# Patient Record
Sex: Male | Born: 1960
Health system: Southern US, Community
[De-identification: ages and names within clinical notes are randomized; demographics above are authoritative.]

## PROBLEM LIST (undated history)

## (undated) DIAGNOSIS — N390 Urinary tract infection, site not specified: Secondary | ICD-10-CM

## (undated) DIAGNOSIS — B019 Varicella without complication: Secondary | ICD-10-CM

## (undated) DIAGNOSIS — D239 Other benign neoplasm of skin, unspecified: Secondary | ICD-10-CM

## (undated) DIAGNOSIS — R011 Cardiac murmur, unspecified: Secondary | ICD-10-CM

## (undated) HISTORY — DX: Urinary tract infection, site not specified: N39.0

## (undated) HISTORY — DX: Cardiac murmur, unspecified: R01.1

## (undated) HISTORY — DX: Other benign neoplasm of skin, unspecified: D23.9

## (undated) HISTORY — DX: Varicella without complication: B01.9

## (undated) HISTORY — PX: KNEE CARTILAGE SURGERY: SHX688

## (undated) HISTORY — PX: WISDOM TOOTH EXTRACTION: SHX21

## (undated) HISTORY — PX: VASECTOMY: SHX75

---
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
## 2013-07-31 ENCOUNTER — Encounter: Payer: Self-pay | Admitting: Physician Assistant

## 2013-07-31 ENCOUNTER — Ambulatory Visit (INDEPENDENT_AMBULATORY_CARE_PROVIDER_SITE_OTHER): Payer: 59 | Admitting: Physician Assistant

## 2013-07-31 VITALS — BP 126/82 | HR 64 | Temp 98.0°F | Resp 16 | Ht 72.0 in | Wt 201.5 lb

## 2013-07-31 DIAGNOSIS — Z0000 Encounter for general adult medical examination without abnormal findings: Secondary | ICD-10-CM

## 2013-07-31 DIAGNOSIS — L989 Disorder of the skin and subcutaneous tissue, unspecified: Secondary | ICD-10-CM

## 2013-07-31 DIAGNOSIS — Z808 Family history of malignant neoplasm of other organs or systems: Secondary | ICD-10-CM

## 2013-07-31 LAB — CBC WITH DIFFERENTIAL/PLATELET
Basophils Absolute: 0 10*3/uL (ref 0.0–0.1)
Basophils Relative: 0 % (ref 0–1)
Eosinophils Absolute: 0.2 10*3/uL (ref 0.0–0.7)
Eosinophils Relative: 4 % (ref 0–5)
HCT: 43.4 % (ref 39.0–52.0)
Hemoglobin: 15 g/dL (ref 13.0–17.0)
Lymphocytes Relative: 25 % (ref 12–46)
Lymphs Abs: 1.4 10*3/uL (ref 0.7–4.0)
MCH: 29.8 pg (ref 26.0–34.0)
MCHC: 34.6 g/dL (ref 30.0–36.0)
MCV: 86.1 fL (ref 78.0–100.0)
Monocytes Absolute: 0.5 10*3/uL (ref 0.1–1.0)
Monocytes Relative: 8 % (ref 3–12)
Neutro Abs: 3.6 10*3/uL (ref 1.7–7.7)
Neutrophils Relative %: 63 % (ref 43–77)
Platelets: 174 10*3/uL (ref 150–400)
RBC: 5.04 MIL/uL (ref 4.22–5.81)
RDW: 14.3 % (ref 11.5–15.5)
WBC: 5.7 10*3/uL (ref 4.0–10.5)

## 2013-07-31 NOTE — Progress Notes (Signed)
Pre visit review using our clinic review tool, if applicable. No additional management support is needed unless otherwise documented below in the visit note/SLS  

## 2013-07-31 NOTE — Patient Instructions (Signed)
Please go to the lab.  I will call you with your results.  You will be contacted by Dermatology and by GI for a colonoscopy.  Follow-up in 1 year and sooner as needed.  Preventive Care for Adults, Male A healthy lifestyle and preventive care can promote health and wellness. Preventive health guidelines for men include the following key practices:  A routine yearly physical is a good way to check with your health care provider about your health and preventative screening. It is a chance to share any concerns and updates on your health and to receive a thorough exam.  Visit your dentist for a routine exam and preventative care every 6 months. Brush your teeth twice a day and floss once a day. Good oral hygiene prevents tooth decay and gum disease.  The frequency of eye exams is based on your age, health, family medical history, use of contact lenses, and other factors. Follow your health care provider's recommendations for frequency of eye exams.  Eat a healthy diet. Foods such as vegetables, fruits, whole grains, low-fat dairy products, and lean protein foods contain the nutrients you need without too many calories. Decrease your intake of foods high in solid fats, added sugars, and salt. Eat the right amount of calories for you.Get information about a proper diet from your health care provider, if necessary.  Regular physical exercise is one of the most important things you can do for your health. Most adults should get at least 150 minutes of moderate-intensity exercise (any activity that increases your heart rate and causes you to sweat) each week. In addition, most adults need muscle-strengthening exercises on 2 or more days a week.  Maintain a healthy weight. The body mass index (BMI) is a screening tool to identify possible weight problems. It provides an estimate of body fat based on height and weight. Your health care provider can find your BMI and can help you achieve or maintain a healthy  weight.For adults 20 years and older:  A BMI below 18.5 is considered underweight.  A BMI of 18.5 to 24.9 is normal.  A BMI of 25 to 29.9 is considered overweight.  A BMI of 30 and above is considered obese.  Maintain normal blood lipids and cholesterol levels by exercising and minimizing your intake of saturated fat. Eat a balanced diet with plenty of fruit and vegetables. Blood tests for lipids and cholesterol should begin at age 55 and be repeated every 5 years. If your lipid or cholesterol levels are high, you are over 50, or you are at high risk for heart disease, you may need your cholesterol levels checked more frequently.Ongoing high lipid and cholesterol levels should be treated with medicines if diet and exercise are not working.  If you smoke, find out from your health care provider how to quit. If you do not use tobacco, do not start.  Lung cancer screening is recommended for adults aged 62 80 years who are at high risk for developing lung cancer because of a history of smoking. A yearly low-dose CT scan of the lungs is recommended for people who have at least a 30-pack-year history of smoking and are a current smoker or have quit within the past 15 years. A pack year of smoking is smoking an average of 1 pack of cigarettes a day for 1 year (for example: 1 pack a day for 30 years or 2 packs a day for 15 years). Yearly screening should continue until the smoker has stopped smoking  for at least 15 years. Yearly screening should be stopped for people who develop a health problem that would prevent them from having lung cancer treatment.  If you choose to drink alcohol, do not have more than 2 drinks per day. One drink is considered to be 12 ounces (355 mL) of beer, 5 ounces (148 mL) of wine, or 1.5 ounces (44 mL) of liquor.  Avoid use of street drugs. Do not share needles with anyone. Ask for help if you need support or instructions about stopping the use of drugs.  High blood pressure  causes heart disease and increases the risk of stroke. Your blood pressure should be checked at least every 1 2 years. Ongoing high blood pressure should be treated with medicines, if weight loss and exercise are not effective.  If you are 31 52 years old, ask your health care provider if you should take aspirin to prevent heart disease.  Diabetes screening involves taking a blood sample to check your fasting blood sugar level. This should be done once every 3 years, after age 31, if you are within normal weight and without risk factors for diabetes. Testing should be considered at a younger age or be carried out more frequently if you are overweight and have at least 1 risk factor for diabetes.  Colorectal cancer can be detected and often prevented. Most routine colorectal cancer screening begins at the age of 42 and continues through age 50. However, your health care provider may recommend screening at an earlier age if you have risk factors for colon cancer. On a yearly basis, your health care provider may provide home test kits to check for hidden blood in the stool. Use of a small camera at the end of a tube to directly examine the colon (sigmoidoscopy or colonoscopy) can detect the earliest forms of colorectal cancer. Talk to your health care provider about this at age 6, when routine screening begins. Direct exam of the colon should be repeated every 5 10 years through age 5, unless early forms of precancerous polyps or small growths are found.  People who are at an increased risk for hepatitis B should be screened for this virus. You are considered at high risk for hepatitis B if:  You were born in a country where hepatitis B occurs often. Talk with your health care provider about which countries are considered high-risk.  Your parents were born in a high-risk country and you have not received a shot to protect against hepatitis B (hepatitis B vaccine).  You have HIV or AIDS.  You use  needles to inject street drugs.  You live with, or have sex with, someone who has hepatitis B.  You are a man who has sex with other men (MSM).  You get hemodialysis treatment.  You take certain medicines for conditions such as cancer, organ transplantation, and autoimmune conditions.  Hepatitis C blood testing is recommended for all people born from 1 through 1965 and any individual with known risks for hepatitis C.  Practice safe sex. Use condoms and avoid high-risk sexual practices to reduce the spread of sexually transmitted infections (STIs). STIs include gonorrhea, chlamydia, syphilis, trichomonas, herpes, HPV, and human immunodeficiency virus (HIV). Herpes, HIV, and HPV are viral illnesses that have no cure. They can result in disability, cancer, and death.  A one-time screening for abdominal aortic aneurysm (AAA) and surgical repair of large AAAs by ultrasound are recommended for men ages 76 to 23 years who are current or former  smokers.  Healthy men should no longer receive prostate-specific antigen (PSA) blood tests as part of routine cancer screening. Talk with your health care provider about prostate cancer screening.  Testicular cancer screening is not recommended for adult males who have no symptoms. Screening includes self-exam, a health care provider exam, and other screening tests. Consult with your health care provider about any symptoms you have or any concerns you have about testicular cancer.  Use sunscreen. Apply sunscreen liberally and repeatedly throughout the day. You should seek shade when your shadow is shorter than you. Protect yourself by wearing long sleeves, pants, a wide-brimmed hat, and sunglasses year round, whenever you are outdoors.  Once a month, do a whole-body skin exam, using a mirror to look at the skin on your back. Tell your health care provider about new moles, moles that have irregular borders, moles that are larger than a pencil eraser, or moles  that have changed in shape or color.  Stay current with required vaccines (immunizations).  Influenza vaccine. All adults should be immunized every year.  Tetanus, diphtheria, and acellular pertussis (Td, Tdap) vaccine. An adult who has not previously received Tdap or who does not know his vaccine status should receive 1 dose of Tdap. This initial dose should be followed by tetanus and diphtheria toxoids (Td) booster doses every 10 years. Adults with an unknown or incomplete history of completing a 3-dose immunization series with Td-containing vaccines should begin or complete a primary immunization series including a Tdap dose. Adults should receive a Td booster every 10 years.  Varicella vaccine. An adult without evidence of immunity to varicella should receive 2 doses or a second dose if he has previously received 1 dose.  Human papillomavirus (HPV) vaccine. Males aged 82 21 years who have not received the vaccine previously should receive the 3-dose series. Males aged 74 26 years may be immunized. Immunization is recommended through the age of 21 years for any male who has sex with males and did not get any or all doses earlier. Immunization is recommended for any person with an immunocompromised condition through the age of 65 years if he did not get any or all doses earlier. During the 3-dose series, the second dose should be obtained 4 8 weeks after the first dose. The third dose should be obtained 24 weeks after the first dose and 16 weeks after the second dose.  Zoster vaccine. One dose is recommended for adults aged 92 years or older unless certain conditions are present.  Measles, mumps, and rubella (MMR) vaccine. Adults born before [birth year] generally are considered immune to measles and mumps. Adults born in [birth year] or later should have 1 or more doses of MMR vaccine unless there is a contraindication to the vaccine or there is laboratory evidence of immunity to each of the three diseases. A  routine second dose of MMR vaccine should be obtained at least 28 days after the first dose for students attending postsecondary schools, health care workers, or international travelers. People who received inactivated measles vaccine or an unknown type of measles vaccine during 1963 1967 should receive 2 doses of MMR vaccine. People who received inactivated mumps vaccine or an unknown type of mumps vaccine before 1979 and are at high risk for mumps infection should consider immunization with 2 doses of MMR vaccine. Unvaccinated health care workers born before [birth year] who lack laboratory evidence of measles, mumps, or rubella immunity or laboratory confirmation of disease should consider measles and mumps immunization with 2  doses of MMR vaccine or rubella immunization with 1 dose of MMR vaccine.  Pneumococcal 13-valent conjugate (PCV13) vaccine. When indicated, a person who is uncertain of his immunization history and has no record of immunization should receive the PCV13 vaccine. An adult aged 28 years or older who has certain medical conditions and has not been previously immunized should receive 1 dose of PCV13 vaccine. This PCV13 should be followed with a dose of pneumococcal polysaccharide (PPSV23) vaccine. The PPSV23 vaccine dose should be obtained at least 8 weeks after the dose of PCV13 vaccine. An adult aged 72 years or older who has certain medical conditions and previously received 1 or more doses of PPSV23 vaccine should receive 1 dose of PCV13. The PCV13 vaccine dose should be obtained 1 or more years after the last PPSV23 vaccine dose.  Pneumococcal polysaccharide (PPSV23) vaccine. When PCV13 is also indicated, PCV13 should be obtained first. All adults aged 68 years and older should be immunized. An adult younger than age 61 years who has certain medical conditions should be immunized. Any person who resides in a nursing home or long-term care facility should be immunized. An adult smoker should be  immunized. People with an immunocompromised condition and certain other conditions should receive both PCV13 and PPSV23 vaccines. People with human immunodeficiency virus (HIV) infection should be immunized as soon as possible after diagnosis. Immunization during chemotherapy or radiation therapy should be avoided. Routine use of PPSV23 vaccine is not recommended for American Indians, [place] Natives, or people younger than 65 years unless there are medical conditions that require PPSV23 vaccine. When indicated, people who have unknown immunization and have no record of immunization should receive PPSV23 vaccine. One-time revaccination 5 years after the first dose of PPSV23 is recommended for people aged 78 64 years who have chronic kidney failure, nephrotic syndrome, asplenia, or immunocompromised conditions. People who received 1 2 doses of PPSV23 before age 56 years should receive another dose of PPSV23 vaccine at age 22 years or later if at least 5 years have passed since the previous dose. Doses of PPSV23 are not needed for people immunized with PPSV23 at or after age 32 years.  Meningococcal vaccine. Adults with asplenia or persistent complement component deficiencies should receive 2 doses of quadrivalent meningococcal conjugate (MenACWY-D) vaccine. The doses should be obtained at least 2 months apart. Microbiologists working with certain meningococcal bacteria, [employer], people at risk during an outbreak, and people who travel to or live in countries with a high rate of meningitis should be immunized. A first-year college student up through age 36 years who is living in a residence hall should receive a dose if he did not receive a dose on or after his 16th birthday. Adults who have certain high-risk conditions should receive one or more doses of vaccine.  Hepatitis A vaccine. Adults who wish to be protected from this disease, have certain high-risk conditions, work with hepatitis A-infected  animals, work in hepatitis A research labs, or travel to or work in countries with a high rate of hepatitis A should be immunized. Adults who were previously unvaccinated and who anticipate close contact with an international adoptee during the first 60 days after arrival in the [place] from a country with a high rate of hepatitis A should be immunized.  Hepatitis B vaccine. Adults who wish to be protected from this disease, have certain high-risk conditions, may be exposed to blood or other infectious body fluids, are household contacts or sex partners of hepatitis  B positive people, are clients or workers in certain care facilities, or travel to or work in countries with a high rate of hepatitis B should be immunized.  Haemophilus influenzae type b (Hib) vaccine. A previously unvaccinated person with asplenia or sickle cell disease or having a scheduled splenectomy should receive 1 dose of Hib vaccine. Regardless of previous immunization, a recipient of a hematopoietic stem cell transplant should receive a 3-dose series 6 12 months after his successful transplant. Hib vaccine is not recommended for adults with HIV infection. Preventive Service / Frequency Ages 1 to 62  Blood pressure check.** / Every 1 to 2 years.  Lipid and cholesterol check.** / Every 5 years beginning at age 29.  Hepatitis C blood test.** / For any individual with known risks for hepatitis C.  Skin self-exam. / Monthly.  Influenza vaccine. / Every year.  Tetanus, diphtheria, and acellular pertussis (Tdap, Td) vaccine.** / Consult your health care provider. 1 dose of Td every 10 years.  Varicella vaccine.** / Consult your health care provider.  HPV vaccine. / 3 doses over 6 months, if 42 or younger.  Measles, mumps, rubella (MMR) vaccine.** / You need at least 1 dose of MMR if you were born in 1957 or later. You may also need a second dose.  Pneumococcal 13-valent conjugate (PCV13) vaccine.** / Consult your  health care provider.  Pneumococcal polysaccharide (PPSV23) vaccine.** / 1 to 2 doses if you smoke cigarettes or if you have certain conditions.  Meningococcal vaccine.** / 1 dose if you are age 7 to 15 years and a Market researcher living in a residence hall, or have one of several medical conditions. You may also need additional booster doses.  Hepatitis A vaccine.** / Consult your health care provider.  Hepatitis B vaccine.** / Consult your health care provider.  Haemophilus influenzae type b (Hib) vaccine.** / Consult your health care provider. Ages 66 to 43  Blood pressure check.** / Every 1 to 2 years.  Lipid and cholesterol check.** / Every 5 years beginning at age 73.  Lung cancer screening. / Every year if you are aged 11 80 years and have a 30-pack-year history of smoking and currently smoke or have quit within the past 15 years. Yearly screening is stopped once you have quit smoking for at least 15 years or develop a health problem that would prevent you from having lung cancer treatment.  Fecal occult blood test (FOBT) of stool. / Every year beginning at age 74 and continuing until age 26. You may not have to do this test if you get a colonoscopy every 10 years.  Flexible sigmoidoscopy** or colonoscopy.** / Every 5 years for a flexible sigmoidoscopy or every 10 years for a colonoscopy beginning at age 66 and continuing until age 87.  Hepatitis C blood test.** / For all people born from [birth year] through 1964 and any individual with known risks for hepatitis C.  Skin self-exam. / Monthly.  Influenza vaccine. / Every year.  Tetanus, diphtheria, and acellular pertussis (Tdap/Td) vaccine.** / Consult your health care provider. 1 dose of Td every 10 years.  Varicella vaccine.** / Consult your health care provider.  Zoster vaccine.** / 1 dose for adults aged 71 years or older.  Measles, mumps, rubella (MMR) vaccine.** / You need at least 1 dose of MMR if you were born in  1956 or later. You may also need a second dose.  Pneumococcal 13-valent conjugate (PCV13) vaccine.** / Consult your health care provider.  Pneumococcal  polysaccharide (PPSV23) vaccine.** / 1 to 2 doses if you smoke cigarettes or if you have certain conditions.  Meningococcal vaccine.** / Consult your health care provider.  Hepatitis A vaccine.** / Consult your health care provider.  Hepatitis B vaccine.** / Consult your health care provider.  Haemophilus influenzae type b (Hib) vaccine.** / Consult your health care provider. Ages 43 and over  Blood pressure check.** / Every 1 to 2 years.  Lipid and cholesterol check.**/ Every 5 years beginning at age 44.  Lung cancer screening. / Every year if you are aged 29 80 years and have a 30-pack-year history of smoking and currently smoke or have quit within the past 15 years. Yearly screening is stopped once you have quit smoking for at least 15 years or develop a health problem that would prevent you from having lung cancer treatment.  Fecal occult blood test (FOBT) of stool. / Every year beginning at age 52 and continuing until age 67. You may not have to do this test if you get a colonoscopy every 10 years.  Flexible sigmoidoscopy** or colonoscopy.** / Every 5 years for a flexible sigmoidoscopy or every 10 years for a colonoscopy beginning at age 78 and continuing until age 82.  Hepatitis C blood test.** / For all people born from [birth year] through 1964 and any individual with known risks for hepatitis C.  Abdominal aortic aneurysm (AAA) screening.** / A one-time screening for ages 28 to 86 years who are current or former smokers.  Skin self-exam. / Monthly.  Influenza vaccine. / Every year.  Tetanus, diphtheria, and acellular pertussis (Tdap/Td) vaccine.** / 1 dose of Td every 10 years.  Varicella vaccine.** / Consult your health care provider.  Zoster vaccine.** / 1 dose for adults aged 73 years or older.  Pneumococcal 13-valent  conjugate (PCV13) vaccine.** / Consult your health care provider.  Pneumococcal polysaccharide (PPSV23) vaccine.** / 1 dose for all adults aged 37 years and older.  Meningococcal vaccine.** / Consult your health care provider.  Hepatitis A vaccine.** / Consult your health care provider.  Hepatitis B vaccine.** / Consult your health care provider.  Haemophilus influenzae type b (Hib) vaccine.** / Consult your health care provider. **Family history and personal history of risk and conditions may change your health care provider's recommendations. Document Released: 07/03/2001 Document Revised: 02/25/2013 Document Reviewed: 10/02/2010 [facility] Patient Information 2014 [place], [place].

## 2013-07-31 NOTE — Progress Notes (Signed)
Patient presents to clinic today to establish care.  Acute Concerns: Skin Lesion -- Patient has a place on his back that is slightly itchy.  Would like it checked out.  Denies excess sun exposure.  Patient denies hx of severe burn.  Denies hx of skin cancer.  Does have significant family history of melanoma.  Chronic Issues: Denies chronic known PMH  Health Maintenance: Dental -- UTD Vision -- UTD Immunizations -- Tetanus in 2012.  Declines flu shot. Colonoscopy -- Overdue. Needs referral. PSA -- never checked; no family hx of prostate cancer  Past Medical History  Diagnosis Date  . Heart murmur     childhood, resolved  . Chicken pox   . UTI (lower urinary tract infection)   . Skin benign neoplasm     Past Surgical History  Procedure Laterality Date  . Adenoidectomy  1969  . Wisdom tooth extraction    . Vasectomy      No current outpatient prescriptions on file prior to visit.   No current facility-administered medications on file prior to visit.    No Known Allergies  Family History  Problem Relation Age of Onset  . Melanoma Father     Living  . Melanoma Paternal Grandfather     Deceased  . Cancer Paternal Grandfather   . Healthy Mother   . Healthy Son   . Healthy Daughter     History   Social History  . Marital Status: Married    Spouse Name: N/A    Number of Children: N/A  . Years of Education: N/A   Occupational History  . Not on file.   Social History Main Topics  . Smoking status: Never Smoker   . Smokeless tobacco: Never Used  . Alcohol Use: 6.0 oz/week    10 Cans of beer per week  . Drug Use: No  . Sexual Activity: Yes    Birth Control/ Protection: Surgical   Other Topics Concern  . Not on file   Social History Narrative  . No narrative on file   Review of Systems  Constitutional: Negative for fever and weight loss.  HENT: Negative for ear discharge, ear pain, hearing loss and tinnitus.   Eyes: Negative for blurred vision, double  vision, photophobia and pain.  Respiratory: Negative for cough and shortness of breath.   Cardiovascular: Negative for chest pain and palpitations.  Gastrointestinal: Negative for heartburn, nausea, vomiting, abdominal pain, diarrhea, constipation, blood in stool and melena.  Genitourinary: Negative for dysuria, urgency, frequency, hematuria and flank pain.       Nocturia x 0-1.  No concerns about erectile function.  Neurological: Negative for dizziness, seizures, loss of consciousness and headaches.  Endo/Heme/Allergies: Negative for environmental allergies.  Psychiatric/Behavioral: Negative for depression, suicidal ideas, hallucinations and substance abuse. The patient is not nervous/anxious and does not have insomnia.    BP 126/82  Pulse 64  Temp(Src) 98 F (36.7 C) (Oral)  Resp 16  Ht 6' (1.829 m)  Wt 201 lb 8 oz (91.4 kg)  BMI 27.32 kg/m2  SpO2 98%  Physical Exam  Vitals reviewed. Constitutional: He is oriented to person, place, and time and well-developed, well-nourished, and in no distress.  HENT:  Head: Normocephalic and atraumatic.  Right Ear: External ear normal.  Left Ear: External ear normal.  Nose: Nose normal.  Mouth/Throat: Oropharynx is clear and moist. No oropharyngeal exudate.  TM within normal limits bilaterally.  Eyes: Conjunctivae are normal. Pupils are equal, round, and reactive to light.  Neck: Neck supple.  Cardiovascular: Normal rate, regular rhythm, normal heart sounds and intact distal pulses.   Pulmonary/Chest: Effort normal and breath sounds normal. No respiratory distress. He has no wheezes. He has no rales. He exhibits no tenderness.  Abdominal: Soft. Bowel sounds are normal. He exhibits no distension and no mass. There is no tenderness. There is no rebound and no guarding.  Genitourinary: Testes/scrotum normal and penis normal.  Lymphadenopathy:    He has no cervical adenopathy.  Neurological: He is alert and oriented to person, place, and time.   Skin: Skin is warm and dry. No rash noted.  Multiple cherry angiomas over torso and extremities.  Lesion of concern is located in the mid thoracic region.  Dichromic and irregular borders.  Seems like a lentigo, however to due characteristics of melanoma needs evaluation by specialist.  Psychiatric: Affect normal.   Assessment/Plan: Cherry angioma Benign lesion.  However, patient has numerous.  Will be checking CMP.  Visit for preventive health examination Medical history reviewed.  Will obtain fasting labs.  Referral to GI placed for colonoscopy.  Family hx of melanoma Questionable skin lesion of back.  Referral to Dermatology placed.

## 2013-08-01 LAB — HEPATIC FUNCTION PANEL
ALT: 18 U/L (ref 0–53)
AST: 19 U/L (ref 0–37)
Albumin: 4.5 g/dL (ref 3.5–5.2)
Alkaline Phosphatase: 50 U/L (ref 39–117)
Bilirubin, Direct: 0.1 mg/dL (ref 0.0–0.3)
Indirect Bilirubin: 0.5 mg/dL (ref 0.2–1.2)
Total Bilirubin: 0.6 mg/dL (ref 0.2–1.2)
Total Protein: 7.1 g/dL (ref 6.0–8.3)

## 2013-08-01 LAB — PSA: PSA: 2.33 ng/mL (ref <=4.00)

## 2013-08-01 LAB — URINALYSIS, ROUTINE W REFLEX MICROSCOPIC
Bilirubin Urine: NEGATIVE
Glucose, UA: NEGATIVE mg/dL
Hgb urine dipstick: NEGATIVE
Ketones, ur: NEGATIVE mg/dL
Leukocytes, UA: NEGATIVE
Nitrite: NEGATIVE
Protein, ur: NEGATIVE mg/dL
Specific Gravity, Urine: 1.016 (ref 1.005–1.030)
Urobilinogen, UA: 0.2 mg/dL (ref 0.0–1.0)
pH: 5.5 (ref 5.0–8.0)

## 2013-08-01 LAB — LIPID PANEL
Cholesterol: 204 mg/dL — ABNORMAL HIGH (ref 0–200)
HDL: 46 mg/dL (ref >39)
LDL Cholesterol: 137 mg/dL — ABNORMAL HIGH (ref 0–99)
Total CHOL/HDL Ratio: 4.4 Ratio
Triglycerides: 107 mg/dL (ref <150)
VLDL: 21 mg/dL (ref 0–40)

## 2013-08-01 LAB — TSH: TSH: 2.068 u[IU]/mL (ref 0.350–4.500)

## 2013-08-01 LAB — BASIC METABOLIC PANEL
BUN: 12 mg/dL (ref 6–23)
CO2: 28 mEq/L (ref 19–32)
Calcium: 9.4 mg/dL (ref 8.4–10.5)
Chloride: 104 mEq/L (ref 96–112)
Creat: 0.93 mg/dL (ref 0.50–1.35)
Glucose, Bld: 90 mg/dL (ref 70–99)
Potassium: 4 mEq/L (ref 3.5–5.3)
Sodium: 140 mEq/L (ref 135–145)

## 2013-08-03 ENCOUNTER — Encounter: Payer: Self-pay | Admitting: Internal Medicine

## 2013-08-03 DIAGNOSIS — I781 Nevus, non-neoplastic: Secondary | ICD-10-CM

## 2013-08-03 DIAGNOSIS — Z808 Family history of malignant neoplasm of other organs or systems: Secondary | ICD-10-CM | POA: Insufficient documentation

## 2013-08-03 DIAGNOSIS — Z0000 Encounter for general adult medical examination without abnormal findings: Secondary | ICD-10-CM | POA: Insufficient documentation

## 2013-08-03 DIAGNOSIS — D1801 Hemangioma of skin and subcutaneous tissue: Secondary | ICD-10-CM | POA: Insufficient documentation

## 2013-08-03 NOTE — Assessment & Plan Note (Signed)
Benign lesion.  However, patient has numerous.  Will be checking CMP.

## 2013-08-03 NOTE — Assessment & Plan Note (Signed)
Questionable skin lesion of back.  Referral to Dermatology placed.

## 2013-08-03 NOTE — Assessment & Plan Note (Signed)
Medical history reviewed.  Will obtain fasting labs.  Referral to GI placed for colonoscopy.

## 2013-09-21 ENCOUNTER — Ambulatory Visit (AMBULATORY_SURGERY_CENTER): Payer: Self-pay | Admitting: *Deleted

## 2013-09-21 VITALS — Ht 72.0 in | Wt 201.2 lb

## 2013-09-21 DIAGNOSIS — Z1211 Encounter for screening for malignant neoplasm of colon: Secondary | ICD-10-CM

## 2013-09-21 HISTORY — PX: OTHER SURGICAL HISTORY: SHX169

## 2013-09-21 MED ORDER — MOVIPREP 100 G PO SOLR: ORAL | Status: DC

## 2013-09-21 NOTE — Progress Notes (Signed)
Patient denies any allergies to eggs or soy. Patient denies any problems with anesthesia/sedation. Patient denies any oxygen use at home and does not take any diet/weight loss medications. EMMI education assisgned to patient on colonoscopy. 

## 2013-10-05 ENCOUNTER — Encounter: Payer: Self-pay | Admitting: Internal Medicine

## 2013-10-05 ENCOUNTER — Ambulatory Visit (AMBULATORY_SURGERY_CENTER): Payer: 59 | Admitting: Internal Medicine

## 2013-10-05 VITALS — BP 125/81 | HR 57 | Temp 96.9°F | Resp 27 | Ht 72.0 in | Wt 201.0 lb

## 2013-10-05 DIAGNOSIS — Z1211 Encounter for screening for malignant neoplasm of colon: Secondary | ICD-10-CM

## 2013-10-05 MED ORDER — SODIUM CHLORIDE 0.9 % IV SOLN: 500.0000 mL | INTRAVENOUS | Status: DC

## 2013-10-05 NOTE — Patient Instructions (Signed)
Discharge instructions given with verbal understanding. Normal exam. Resume previous medications. YOU HAD AN ENDOSCOPIC PROCEDURE TODAY AT [facility]: Refer to the procedure report that was given to you for any specific questions about what was found during the examination.  If the procedure report does not answer your questions, please call your gastroenterologist to clarify.  If you requested that your care partner not be given the details of your procedure findings, then the procedure report has been included in a sealed envelope for you to review at your convenience later.  YOU SHOULD EXPECT: Some feelings of bloating in the abdomen. Passage of more gas than usual.  Walking can help get rid of the air that was put into your GI tract during the procedure and reduce the bloating. If you had a lower endoscopy (such as a colonoscopy or flexible sigmoidoscopy) you may notice spotting of blood in your stool or on the toilet paper. If you underwent a bowel prep for your procedure, then you may not have a normal bowel movement for a few days.  DIET: Your first meal following the procedure should be a light meal and then it is ok to progress to your normal diet.  A half-sandwich or bowl of soup is an example of a good first meal.  Heavy or fried foods are harder to digest and may make you feel nauseous or bloated.  Likewise meals heavy in dairy and vegetables can cause extra gas to form and this can also increase the bloating.  Drink plenty of fluids but you should avoid alcoholic beverages for 24 hours.  ACTIVITY: Your care partner should take you home directly after the procedure.  You should plan to take it easy, moving slowly for the rest of the day.  You can resume normal activity the day after the procedure however you should NOT DRIVE or use heavy machinery for 24 hours (because of the sedation medicines used during the test).    SYMPTOMS TO REPORT IMMEDIATELY: A gastroenterologist  can be reached at any hour.  During normal business hours, 8:30 AM to 5:00 PM Monday through Friday, call [phone].  After hours and on weekends, please call the GI answering service at [phone] who will take a message and have the physician on call contact you.   Following lower endoscopy (colonoscopy or flexible sigmoidoscopy):  Excessive amounts of blood in the stool  Significant tenderness or worsening of abdominal pains  Swelling of the abdomen that is new, acute  Fever of 100F or higher   Black, tarry-looking stoolsFollowing upper endoscopy (EGD)   FOLLOW UP: If any biopsies were taken you will be contacted by phone or by letter within the next 1-3 weeks.  Call your gastroenterologist if you have not heard about the biopsies in 3 weeks.  Our staff will call the home number listed on your records the next business day following your procedure to check on you and address any questions or concerns that you may have at that time regarding the information given to you following your procedure. This is a courtesy call and so if there is no answer at the home number and we have not heard from you through the emergency physician on call, we will assume that you have returned to your regular daily activities without incident.  SIGNATURES/CONFIDENTIALITY: You and/or your care partner have signed paperwork which will be entered into your electronic medical record.  These signatures attest to the fact that that the information  above on your After Visit Summary has been reviewed and is understood.  Full responsibility of the confidentiality of this discharge information lies with you and/or your care-partner. 

## 2013-10-05 NOTE — Progress Notes (Signed)
Pt drowst adequate resp on rA report given to PACU RN

## 2013-10-05 NOTE — Op Note (Signed)
[facility]  [address] [place], [address]   COLONOSCOPY PROCEDURE REPORT  PATIENT: Ronald Ramos, Ronald Ramos  MR#: [id number] BIRTHDATE: 1961-03-03 , 53  yrs. old GENDER: Male ENDOSCOPIST: Eustace Quail, MD REFERRED QV:[id number] [facility], [facility]. PROCEDURE DATE:  10/05/2013 PROCEDURE:   Colonoscopy, screening First Screening Colonoscopy - Avg.  risk and is 50 yrs.  old or older Yes.  Prior Negative Screening - Now for repeat screening. N/A  History of Adenoma - Now for follow-up colonoscopy & has been > or = to 3 yrs.  N/A  Polyps Removed Today? No.  Recommend repeat exam, <10 yrs? No. ASA CLASS:   Class II INDICATIONS:average risk screening. MEDICATIONS: MAC sedation, administered by CRNA and propofol (Diprivan) 330mg  IV  DESCRIPTION OF PROCEDURE:   After the risks benefits and alternatives of the procedure were thoroughly explained, informed consent was obtained.  A digital rectal exam revealed no abnormalities of the rectum.   The LB [id number]  endoscope was introduced through the anus and advanced to the cecum, which was identified by both the appendix and ileocecal valve. No adverse events experienced.   The quality of the prep was excellent, using MoviPrep  The instrument was then slowly withdrawn as the colon was fully examined.      COLON FINDINGS: A normal appearing cecum, ileocecal valve, and appendiceal orifice were identified.  The ascending, hepatic flexure, transverse, splenic flexure, descending, sigmoid colon and rectum appeared unremarkable.  No polyps or cancers were seen. Retroflexed views revealed internal hemorrhoids. The time to cecum=1 minutes 38 seconds.  Withdrawal time=7 minutes 34 seconds. The scope was withdrawn and the procedure completed.  COMPLICATIONS: There were no complications.  ENDOSCOPIC IMPRESSION: Normal colon  RECOMMENDATIONS: Continue current colorectal screening recommendations for "routine risk" patients with a  repeat colonoscopy in 10 years.   eSigned:  Eustace Quail, MD 10/05/2013 10:09 AM   cc: The Patient and Brunetta Jeans, [facility]

## 2013-10-06 ENCOUNTER — Telehealth: Payer: Self-pay

## 2013-10-06 NOTE — Telephone Encounter (Signed)
Left a message at # [phone] for the pt to call if any questions or concerns. maw

## 2015-04-04 ENCOUNTER — Ambulatory Visit (INDEPENDENT_AMBULATORY_CARE_PROVIDER_SITE_OTHER): Payer: 59 | Admitting: Family Medicine

## 2015-04-04 ENCOUNTER — Encounter: Payer: Self-pay | Admitting: Family Medicine

## 2015-04-04 ENCOUNTER — Encounter (INDEPENDENT_AMBULATORY_CARE_PROVIDER_SITE_OTHER): Payer: Self-pay

## 2015-04-04 VITALS — BP 125/88 | HR 60 | Ht 73.0 in | Wt 205.0 lb

## 2015-04-04 DIAGNOSIS — M25562 Pain in left knee: Secondary | ICD-10-CM

## 2015-04-04 NOTE — Patient Instructions (Signed)
Your knee pain is consistent with mild arthritis though a degenerative medial meniscus tear is also a possibility. Both are treated similarly initially. Ibuprofen 600 or 800mg  three times a day with food Cortisone injections are an option. It's important that you continue to stay active. Straight leg raises, straight leg raises with foot turned outwards, knee extensions 3 sets of 10 once a day (add ankle weight if these become too easy). Consider physical therapy to strengthen muscles around the joint that hurts to take pressure off of the joint itself. Ice 15 minutes at a time 3-4 times a day as needed to help with pain. Try to avoid deep squats, lunges Follow up with me in 6 weeks for reevaluation. We will consider MRI if not improving as expected.

## 2015-04-05 DIAGNOSIS — M25562 Pain in left knee: Secondary | ICD-10-CM | POA: Insufficient documentation

## 2015-04-05 NOTE — Progress Notes (Signed)
PCP: Leeanne Rio, PA-C  Subjective:   HPI: Patient is a 54 y.o. male here for left knee pain.  Patient denies known injury. He reports 6 months of worsening medial left knee pain. Pain level 2/10, up to 6/10 with movement, sharp. No swelling. Tried ibuprofen. Worse with weight bearing, better with simple motion exercises. No catching, locking. No skin changes, fever, other complaints.  Past Medical History  Diagnosis Date  . Heart murmur     childhood, resolved  . Chicken pox   . UTI (lower urinary tract infection)   . Skin benign neoplasm     No current outpatient prescriptions on file prior to visit.   No current facility-administered medications on file prior to visit.    Past Surgical History  Procedure Laterality Date  . Adenoidectomy  1969  . Wisdom tooth extraction    . Vasectomy    . Knee cartilage surgery    . Removal skin lesions Right 09/21/13    removal lesion right chest wall and mid upper back    No Known Allergies  Social History   Social History  . Marital Status: Married    Spouse Name: N/A  . Number of Children: N/A  . Years of Education: N/A   Occupational History  . Not on file.   Social History Main Topics  . Smoking status: Never Smoker   . Smokeless tobacco: Never Used  . Alcohol Use: 4.8 oz/week    8 Cans of beer per week  . Drug Use: No  . Sexual Activity: Yes    Birth Control/ Protection: Surgical   Other Topics Concern  . Not on file   Social History Narrative    Family History  Problem Relation Age of Onset  . Melanoma Father     Living  . Melanoma Paternal Grandfather     Deceased  . Cancer Paternal Grandfather   . Healthy Mother   . Healthy Son   . Healthy Daughter   . Colon cancer Neg Hx     BP 125/88 mmHg  Pulse 60  Ht 6\' 1"  (1.854 m)  Wt 205 lb (92.987 kg)  BMI 27.05 kg/m2  Review of Systems: See HPI above.    Objective:  Physical Exam:  Gen: NAD  Left knee: No gross deformity,  ecchymoses, effusion. TTP medial joint line.  No other tenderness. FROM. Negative ant/post drawers. Negative valgus/varus testing. Negative lachmanns. Negative mcmurrays, apleys, patellar apprehension. NV intact distally.  Right knee: FROM without pain.    Assessment & Plan:  1. Left knee pain - consistent with DJD, less likely degenerative medial meniscus tear.  Shown home exercises to do daily.  Ibuprofen.  Discussed tylenol, glucosamine, topical medications.  Consider PT, MRI, injection if not improving at f/u in 6 weeks.

## 2015-04-05 NOTE — Assessment & Plan Note (Signed)
consistent with DJD, less likely degenerative medial meniscus tear.  Shown home exercises to do daily.  Ibuprofen.  Discussed tylenol, glucosamine, topical medications.  Consider PT, MRI, injection if not improving at f/u in 6 weeks.

## 2015-05-24 ENCOUNTER — Encounter (INDEPENDENT_AMBULATORY_CARE_PROVIDER_SITE_OTHER): Payer: Self-pay

## 2015-05-24 ENCOUNTER — Ambulatory Visit (INDEPENDENT_AMBULATORY_CARE_PROVIDER_SITE_OTHER): Payer: 59 | Admitting: Family Medicine

## 2015-05-24 ENCOUNTER — Encounter: Payer: Self-pay | Admitting: Family Medicine

## 2015-05-24 VITALS — BP 135/97 | HR 58 | Ht 73.0 in | Wt 200.0 lb

## 2015-05-24 DIAGNOSIS — M25562 Pain in left knee: Secondary | ICD-10-CM | POA: Diagnosis not present

## 2015-05-24 NOTE — Progress Notes (Addendum)
PCP: Leeanne Rio, PA-C  Subjective:   HPI: Patient is a 55 y.o. male here for left knee pain.  04/04/15: Patient denies known injury. He reports 6 months of worsening medial left knee pain. Pain level 2/10, up to 6/10 with movement, sharp. No swelling. Tried ibuprofen. Worse with weight bearing, better with simple motion exercises. No catching, locking. No skin changes, fever, other complaints.  05/24/15: Patient reports he improved after resting for almost 6 weeks then played golf a couple times without problems. Tried jogging and doing side to side work and pain recurred. Pain is medial, sharp. Pain level 1/10 currently. Worse with twisting and these side to side motions. No catching, locking. No skin changes, fever, other complaints.  Past Medical History  Diagnosis Date  . Heart murmur     childhood, resolved  . Chicken pox   . UTI (lower urinary tract infection)   . Skin benign neoplasm     No current outpatient prescriptions on file prior to visit.   No current facility-administered medications on file prior to visit.    Past Surgical History  Procedure Laterality Date  . Adenoidectomy  1969  . Wisdom tooth extraction    . Vasectomy    . Knee cartilage surgery    . Removal skin lesions Right 09/21/13    removal lesion right chest wall and mid upper back    No Known Allergies  Social History   Social History  . Marital Status: Married    Spouse Name: N/A  . Number of Children: N/A  . Years of Education: N/A   Occupational History  . Not on file.   Social History Main Topics  . Smoking status: Never Smoker   . Smokeless tobacco: Never Used  . Alcohol Use: 4.8 oz/week    8 Cans of beer per week  . Drug Use: No  . Sexual Activity: Yes    Birth Control/ Protection: Surgical   Other Topics Concern  . Not on file   Social History Narrative    Family History  Problem Relation Age of Onset  . Melanoma Father     Living  . Melanoma  Paternal Grandfather     Deceased  . Cancer Paternal Grandfather   . Healthy Mother   . Healthy Son   . Healthy Daughter   . Colon cancer Neg Hx     BP 135/97 mmHg  Pulse 58  Ht 6\' 1"  (1.854 m)  Wt 200 lb (90.719 kg)  BMI 26.39 kg/m2  Review of Systems: See HPI above.    Objective:  Physical Exam:  Gen: NAD, comfortable in exam room.  Left knee: No gross deformity, ecchymoses, effusion. TTP medial joint line.  No other tenderness. FROM. Negative ant/post drawers. Negative valgus/varus testing. Negative lachmanns. Negative mcmurrays, apleys, patellar apprehension. NV intact distally.  Right knee: FROM without pain.    Assessment & Plan:  1. Left knee pain - consistent with DJD, less likely degenerative medial meniscus tear.  We discussed options and he would like to go ahead with imaging - MRI ordered.  Consider PT, injection in future.  Addendum:  MRI reviewed and discussed with patient.  He has a medial meniscus tear and mild DJD.  Suspect given the appearance of both that the meniscus tear is more likely the cause of his pain but advised I would recommend conservative measures first.  He will consider injection, PT vs ortho referral and call us with how he would like to proceed.

## 2015-05-24 NOTE — Assessment & Plan Note (Signed)
consistent with DJD, less likely degenerative medial meniscus tear.  We discussed options and he would like to go ahead with imaging - MRI ordered.  Consider PT, injection in future.

## 2015-05-24 NOTE — Patient Instructions (Signed)
We will go ahead with an MRI to assess for medial meniscus tear vs degree of arthritis.

## 2015-05-26 ENCOUNTER — Ambulatory Visit (HOSPITAL_COMMUNITY)
Admission: RE | Admit: 2015-05-26 | Discharge: 2015-05-26 | Disposition: A | Discharge status: Home / Self Care | Payer: 59 | Source: Ambulatory Visit | Attending: Family Medicine | Admitting: Family Medicine

## 2015-05-26 DIAGNOSIS — M94262 Chondromalacia, left knee: Secondary | ICD-10-CM | POA: Diagnosis not present

## 2015-05-26 DIAGNOSIS — M25562 Pain in left knee: Secondary | ICD-10-CM | POA: Insufficient documentation

## 2015-05-26 DIAGNOSIS — S83242A Other tear of medial meniscus, current injury, left knee, initial encounter: Secondary | ICD-10-CM | POA: Diagnosis not present

## 2015-05-26 DIAGNOSIS — X58XXXA Exposure to other specified factors, initial encounter: Secondary | ICD-10-CM | POA: Insufficient documentation

## 2015-05-28 ENCOUNTER — Ambulatory Visit (HOSPITAL_BASED_OUTPATIENT_CLINIC_OR_DEPARTMENT_OTHER): Payer: 59

## 2016-04-16 ENCOUNTER — Emergency Department (HOSPITAL_COMMUNITY): Payer: 59

## 2016-04-16 ENCOUNTER — Emergency Department (HOSPITAL_COMMUNITY)
Admission: EM | Admit: 2016-04-16 | Discharge: 2016-04-16 | Disposition: A | Discharge status: Home / Self Care | Payer: 59 | Attending: Emergency Medicine | Admitting: Emergency Medicine

## 2016-04-16 ENCOUNTER — Encounter (HOSPITAL_COMMUNITY): Payer: Self-pay | Admitting: Emergency Medicine

## 2016-04-16 DIAGNOSIS — S62522A Displaced fracture of distal phalanx of left thumb, initial encounter for closed fracture: Secondary | ICD-10-CM | POA: Diagnosis not present

## 2016-04-16 DIAGNOSIS — Y999 Unspecified external cause status: Secondary | ICD-10-CM | POA: Insufficient documentation

## 2016-04-16 DIAGNOSIS — Z79899 Other long term (current) drug therapy: Secondary | ICD-10-CM | POA: Insufficient documentation

## 2016-04-16 DIAGNOSIS — W312XXA Contact with powered woodworking and forming machines, initial encounter: Secondary | ICD-10-CM | POA: Diagnosis not present

## 2016-04-16 DIAGNOSIS — Y929 Unspecified place or not applicable: Secondary | ICD-10-CM | POA: Diagnosis not present

## 2016-04-16 DIAGNOSIS — Y939 Activity, unspecified: Secondary | ICD-10-CM | POA: Diagnosis not present

## 2016-04-16 DIAGNOSIS — Z23 Encounter for immunization: Secondary | ICD-10-CM | POA: Diagnosis not present

## 2016-04-16 DIAGNOSIS — S61012A Laceration without foreign body of left thumb without damage to nail, initial encounter: Secondary | ICD-10-CM | POA: Diagnosis not present

## 2016-04-16 DIAGNOSIS — S61112A Laceration without foreign body of left thumb with damage to nail, initial encounter: Secondary | ICD-10-CM | POA: Diagnosis not present

## 2016-04-16 MED ORDER — LIDOCAINE HCL (PF) 1 % IJ SOLN
5.0000 mL | Freq: Once | INTRAMUSCULAR | Status: AC
Start: 2016-04-16 — End: 2016-04-16
  Administered 2016-04-16: 5 mL
  Filled 2016-04-16: qty 30

## 2016-04-16 MED ORDER — CEFAZOLIN SODIUM-DEXTROSE 2-4 GM/100ML-% IV SOLN
2.0000 g | Freq: Once | INTRAVENOUS | Status: AC
  Administered 2016-04-16: 2 g via INTRAVENOUS
  Filled 2016-04-16: qty 100

## 2016-04-16 MED ORDER — TETANUS-DIPHTH-ACELL PERTUSSIS 5-2.5-18.5 LF-MCG/0.5 IM SUSP
0.5000 mL | Freq: Once | INTRAMUSCULAR | Status: AC
  Administered 2016-04-16: 0.5 mL via INTRAMUSCULAR
  Filled 2016-04-16: qty 0.5

## 2016-04-16 MED ORDER — CEPHALEXIN 500 MG PO CAPS: 500.0000 mg | ORAL_CAPSULE | Freq: Four times a day (QID) | ORAL | 0 refills | Status: AC

## 2016-04-16 MED ORDER — OXYCODONE-ACETAMINOPHEN 5-325 MG PO TABS: 1.0000 | ORAL_TABLET | Freq: Four times a day (QID) | ORAL | 0 refills | Status: AC | PRN

## 2016-04-16 NOTE — ED Notes (Signed)
Patient hand soaking in warm water and iodine

## 2016-04-16 NOTE — ED Triage Notes (Signed)
Pt states that he cut his L thumb on a table saw tonight. Laceration the length of the thumb. Bleeding controlled at this time. Unknown tetanus. Alert and oriented.

## 2016-04-16 NOTE — ED Provider Notes (Signed)
[facility] DEPT Provider Note   CSN: [id number] Arrival date & time: 04/16/16  2101     History   Chief Complaint Chief Complaint  Patient presents with  . Extremity Laceration    HPI Ronald Ramos is a 55 y.o. male.  HPI  Patient is right-handed and today he was cutting some wood on a table saw he was pushing with a stick when it hit a knot and kicked, pulling his left thumb into the saw. Injury only to the thumb. The endovenous thumb is numb. Last tetanus is unknown. He ate dinner around 4 hours ago. He is otherwise healthy. Past Medical History:  Diagnosis Date  . Chicken pox   . Heart murmur    childhood, resolved  . Skin benign neoplasm   . UTI (lower urinary tract infection)     Patient Active Problem List   Diagnosis Date Noted  . Left knee pain 04/05/2015  . Visit for preventive health examination 08/03/2013  . Cherry angioma 08/03/2013  . Family hx of melanoma 08/03/2013    Past Surgical History:  Procedure Laterality Date  . ADENOIDECTOMY  1969  . KNEE CARTILAGE SURGERY    . removal skin lesions Right 09/21/13   removal lesion right chest wall and mid upper back  . VASECTOMY    . WISDOM TOOTH EXTRACTION         Home Medications    Prior to Admission medications   Medication Sig Start Date End Date Taking? Authorizing Provider  ibuprofen (ADVIL,MOTRIN) 200 MG tablet Take 600 mg by mouth every 6 (six) hours as needed (pain- for finger).   Yes Historical Provider, MD  cephALEXin (KEFLEX) 500 MG capsule Take 1 capsule (500 mg total) by mouth 4 (four) times daily. 04/16/16   Davonna Belling, MD  oxyCODONE-acetaminophen (PERCOCET/ROXICET) 5-325 MG tablet Take 1-2 tablets by mouth every 6 (six) hours as needed for severe pain. 04/16/16   Davonna Belling, MD    Family History Family History  Problem Relation Age of Onset  . Melanoma Father     Living  . Melanoma Paternal Grandfather     Deceased  . Cancer Paternal Grandfather   . Healthy Mother    . Healthy Son   . Healthy Daughter   . Colon cancer Neg Hx     Social History Social History  Substance Use Topics  . Smoking status: Never Smoker  . Smokeless tobacco: Never Used  . Alcohol use 4.8 oz/week    8 Cans of beer per week     Allergies   Patient has no known allergies.   Review of Systems Review of Systems  Constitutional: Negative for appetite change and fever.  Respiratory: Negative for shortness of breath.   Cardiovascular: Negative for chest pain.  Gastrointestinal: Negative for abdominal pain.  Musculoskeletal: Negative for back pain.  Skin: Positive for wound.  Neurological: Positive for numbness. Negative for weakness.  Psychiatric/Behavioral: Negative for confusion.     Physical Exam Updated Vital Signs BP 123/86 (BP Location: Right Arm)   Pulse 70   Temp 98.2 F (36.8 C) (Oral)   Resp 18   Ht 6\' 1"  (1.854 m)   Wt 200 lb (90.7 kg)   SpO2 96%   BMI 26.39 kg/m   Physical Exam  Constitutional: He appears well-developed.  HENT:  Head: Atraumatic.  Eyes: EOM are normal.  Cardiovascular: Normal rate.   Pulmonary/Chest: Effort normal.  Abdominal: Soft.  Musculoskeletal:  Laceration across the palmar aspect of right thumb.  One end of the small wound is distal to the tip of the nail. It progresses down along the palmar aspect at angle to the medial aspect of the thumb and curves around to the base of the nail. Sensation is not intact on the distal aspect of the wound. Good flexion and extension at the IP joint.   Neurological: He is alert.  Skin: Skin is warm.         ED Treatments / Results  Labs (all labs ordered are listed, but only abnormal results are displayed) Labs Reviewed - No data to display  EKG  EKG Interpretation None       Radiology Dg Finger Thumb Left  Result Date: 04/16/2016 CLINICAL DATA:  Cut with table saw EXAM: LEFT THUMB 2+V COMPARISON:  None. FINDINGS: Markedly comminuted and distracted fracture  involving the tuft and distal shaft of the first distal phalanx with overlying soft tissue injury. Suspected that there are some missing bone fragments. IMPRESSION: Markedly comminuted and distracted fracture involving the tuft of the first distal phalanx with overlying soft tissue injury. Electronically Signed   By: Donavan Foil M.D.   On: 04/16/2016 21:43    Procedures Procedures (including critical care time)  Medications Ordered in ED Medications  ceFAZolin (ANCEF) IVPB 2g/100 mL premix (0 g Intravenous Stopped 04/16/16 2340)  lidocaine (PF) (XYLOCAINE) 1 % injection 5 mL (5 mLs Infiltration Given by Other 04/16/16 2250)  Tdap (BOOSTRIX) injection 0.5 mL (0.5 mLs Intramuscular Given 04/16/16 2248)     Initial Impression / Assessment and Plan / ED Course  I have reviewed the triage vital signs and the nursing notes.  Pertinent labs & imaging results that were available during my care of the patient were reviewed by me and considered in my medical decision making (see chart for details).  Clinical Course     Patient with table saw injury to thumb. Reviewed x-ray and images with hand surgeon, Dr. Grandville Silos. Will see patient in follow-up 2 days. Wound closed by myself. Tetanus updated given antibiotics. Discharge.  LACERATION REPAIR Performed by: Mackie Pai Authorized by: Mackie Pai Consent: Verbal consent obtained. Risks and benefits: risks, benefits and alternatives were discussed Consent given by: patient Patient identity confirmed: provided demographic data Prepped and Draped in normal sterile fashion Wound explored  Laceration Location: left thumb  Laceration Length: 3cm  No Foreign Bodies seen or palpated  Anesthesia: digital block  Local anesthetic: lidocaine 1% *  Anesthetic total: 71ml  Irrigation method: syringe Amount of cleaning: standard  Skin closure: 4-0 chromic gut  Number of sutures: 9  Technique: simple interupted  Patient  tolerance: Patient tolerated the procedure well with no immediate complications.  Final Clinical Impressions(s) / ED Diagnoses   Final diagnoses:  Laceration of left thumb with damage to nail, foreign body presence unspecified, initial encounter    New Prescriptions Discharge Medication List as of 04/16/2016 11:40 PM    START taking these medications   Details  cephALEXin (KEFLEX) 500 MG capsule Take 1 capsule (500 mg total) by mouth 4 (four) times daily., Starting Mon 04/16/2016, Print    oxyCODONE-acetaminophen (PERCOCET/ROXICET) 5-325 MG tablet Take 1-2 tablets by mouth every 6 (six) hours as needed for severe pain., Starting Mon 04/16/2016, Print         Davonna Belling, MD 04/17/16 [phone]

## 2016-04-18 DIAGNOSIS — S61112A Laceration without foreign body of left thumb with damage to nail, initial encounter: Secondary | ICD-10-CM | POA: Diagnosis not present

## 2016-05-02 DIAGNOSIS — S62112D Displaced fracture of triquetrum [cuneiform] bone, left wrist, subsequent encounter for fracture with routine healing: Secondary | ICD-10-CM | POA: Diagnosis not present

## 2016-06-12 ENCOUNTER — Encounter: Payer: Self-pay | Admitting: Physician Assistant

## 2016-06-12 ENCOUNTER — Telehealth: Payer: 59 | Admitting: Family

## 2016-06-12 DIAGNOSIS — R05 Cough: Secondary | ICD-10-CM

## 2016-06-12 DIAGNOSIS — R059 Cough, unspecified: Secondary | ICD-10-CM

## 2016-06-12 DIAGNOSIS — R6889 Other general symptoms and signs: Secondary | ICD-10-CM | POA: Diagnosis not present

## 2016-06-12 MED ORDER — OSELTAMIVIR PHOSPHATE 75 MG PO CAPS: 75.0000 mg | ORAL_CAPSULE | Freq: Two times a day (BID) | ORAL | 0 refills | Status: AC

## 2016-06-12 MED ORDER — OSELTAMIVIR PHOSPHATE 75 MG PO CAPS: 75.0000 mg | ORAL_CAPSULE | Freq: Two times a day (BID) | ORAL | 0 refills | Status: DC

## 2016-06-12 NOTE — Progress Notes (Signed)

## 2016-06-12 NOTE — Addendum Note (Signed)
Addended by: Dutch Quint B on: 06/12/2016 03:53 PM   Modules accepted: Orders

## 2016-06-12 NOTE — Addendum Note (Signed)
Addended by: Chevis Pretty on: 06/12/2016 05:26 PM   Modules accepted: Orders

## 2016-06-12 NOTE — Telephone Encounter (Signed)
I have not seen patient since 2015. I would need to see him before prescribing anything. I am wondering if he meant to send message to e-visit provider as he did do e-visit earlier today.

## 2016-11-14 DIAGNOSIS — D225 Melanocytic nevi of trunk: Secondary | ICD-10-CM | POA: Diagnosis not present

## 2016-11-14 DIAGNOSIS — L249 Irritant contact dermatitis, unspecified cause: Secondary | ICD-10-CM | POA: Diagnosis not present

## 2016-11-14 DIAGNOSIS — L821 Other seborrheic keratosis: Secondary | ICD-10-CM | POA: Diagnosis not present

## 2017-03-21 HISTORY — PX: HAND SURGERY: SHX662

## 2017-06-16 IMAGING — CR DG FINGER THUMB 2+V*L*
3 series · 3 of 3 positions shown · non-contrast
Comparison: None.

CLINICAL DATA: Cut with table saw

EXAM:
LEFT THUMB 2+V

[x finger pa left]
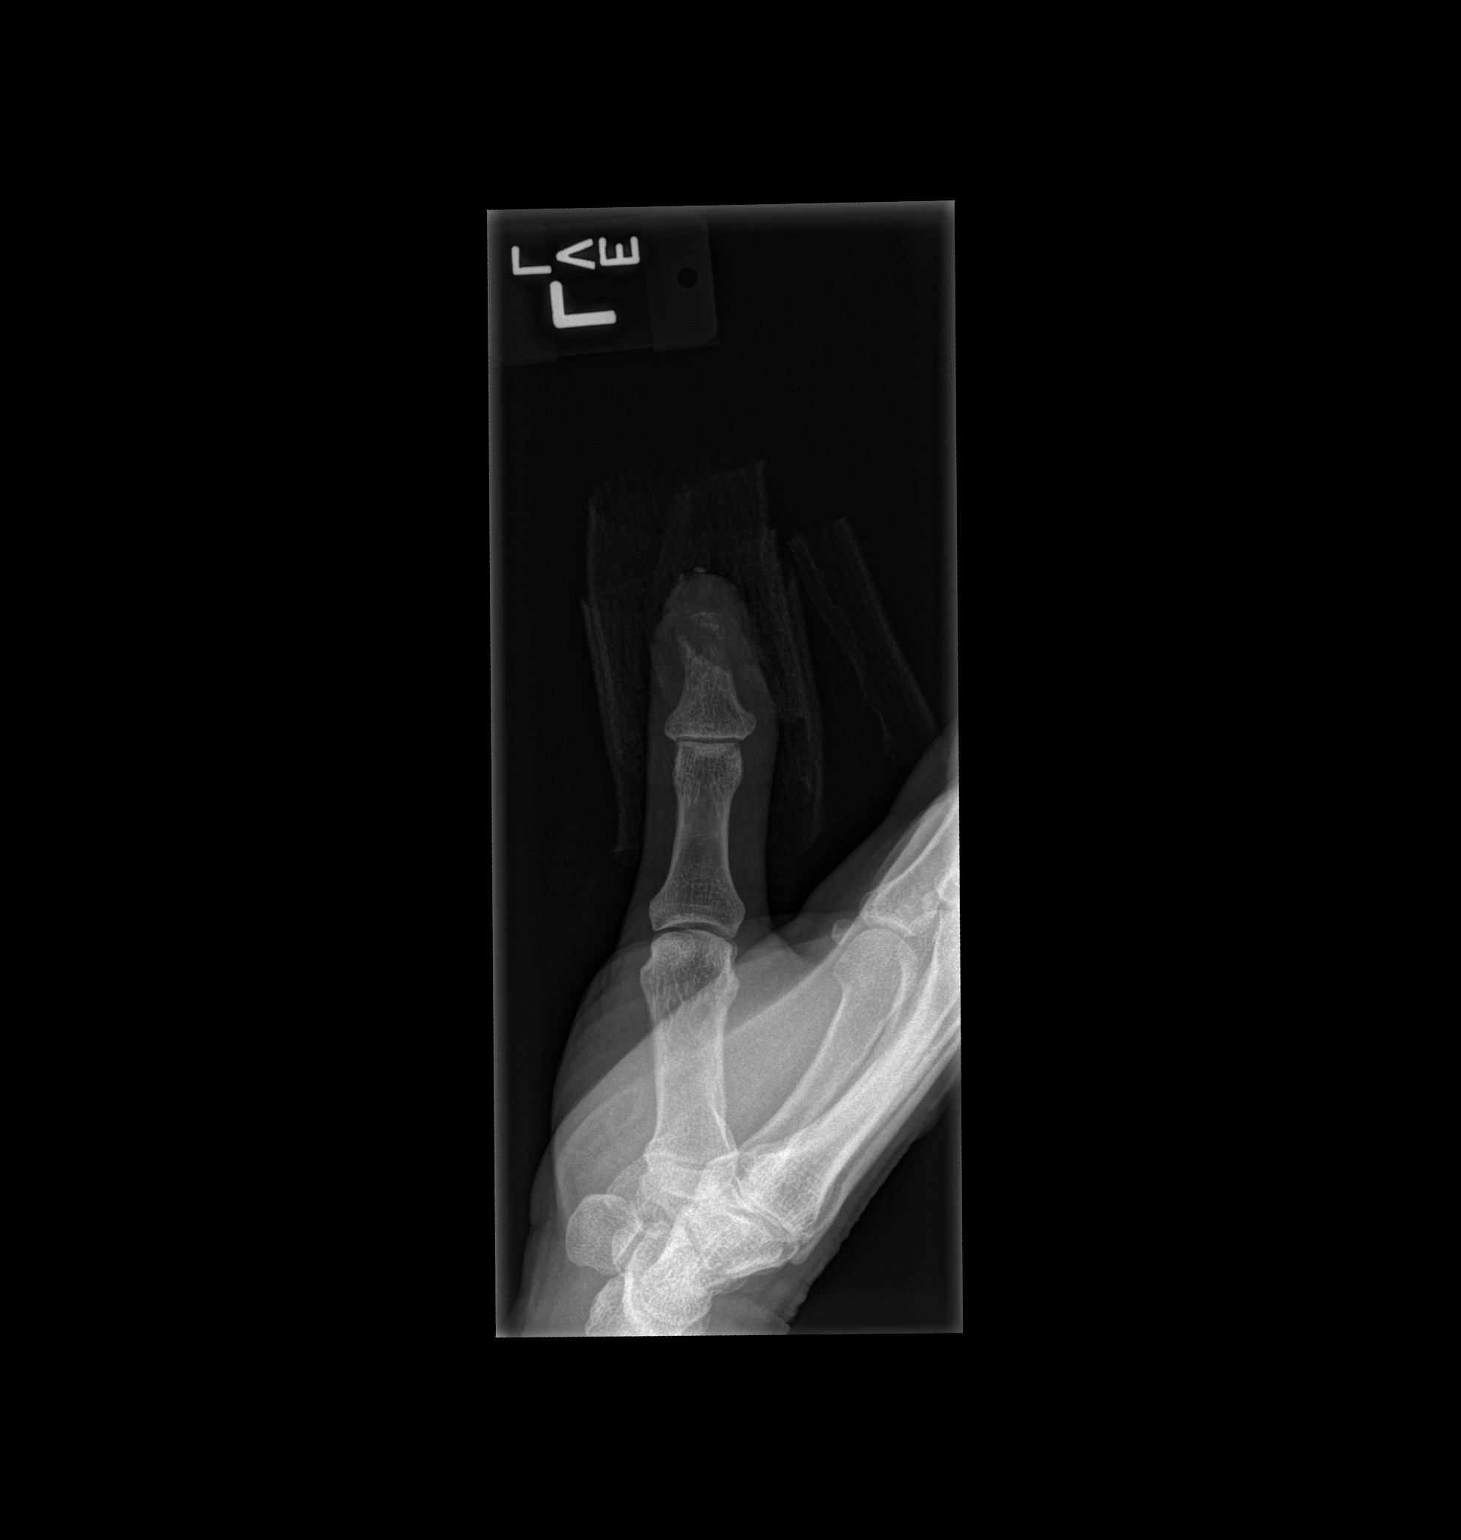

[x finger obl left]
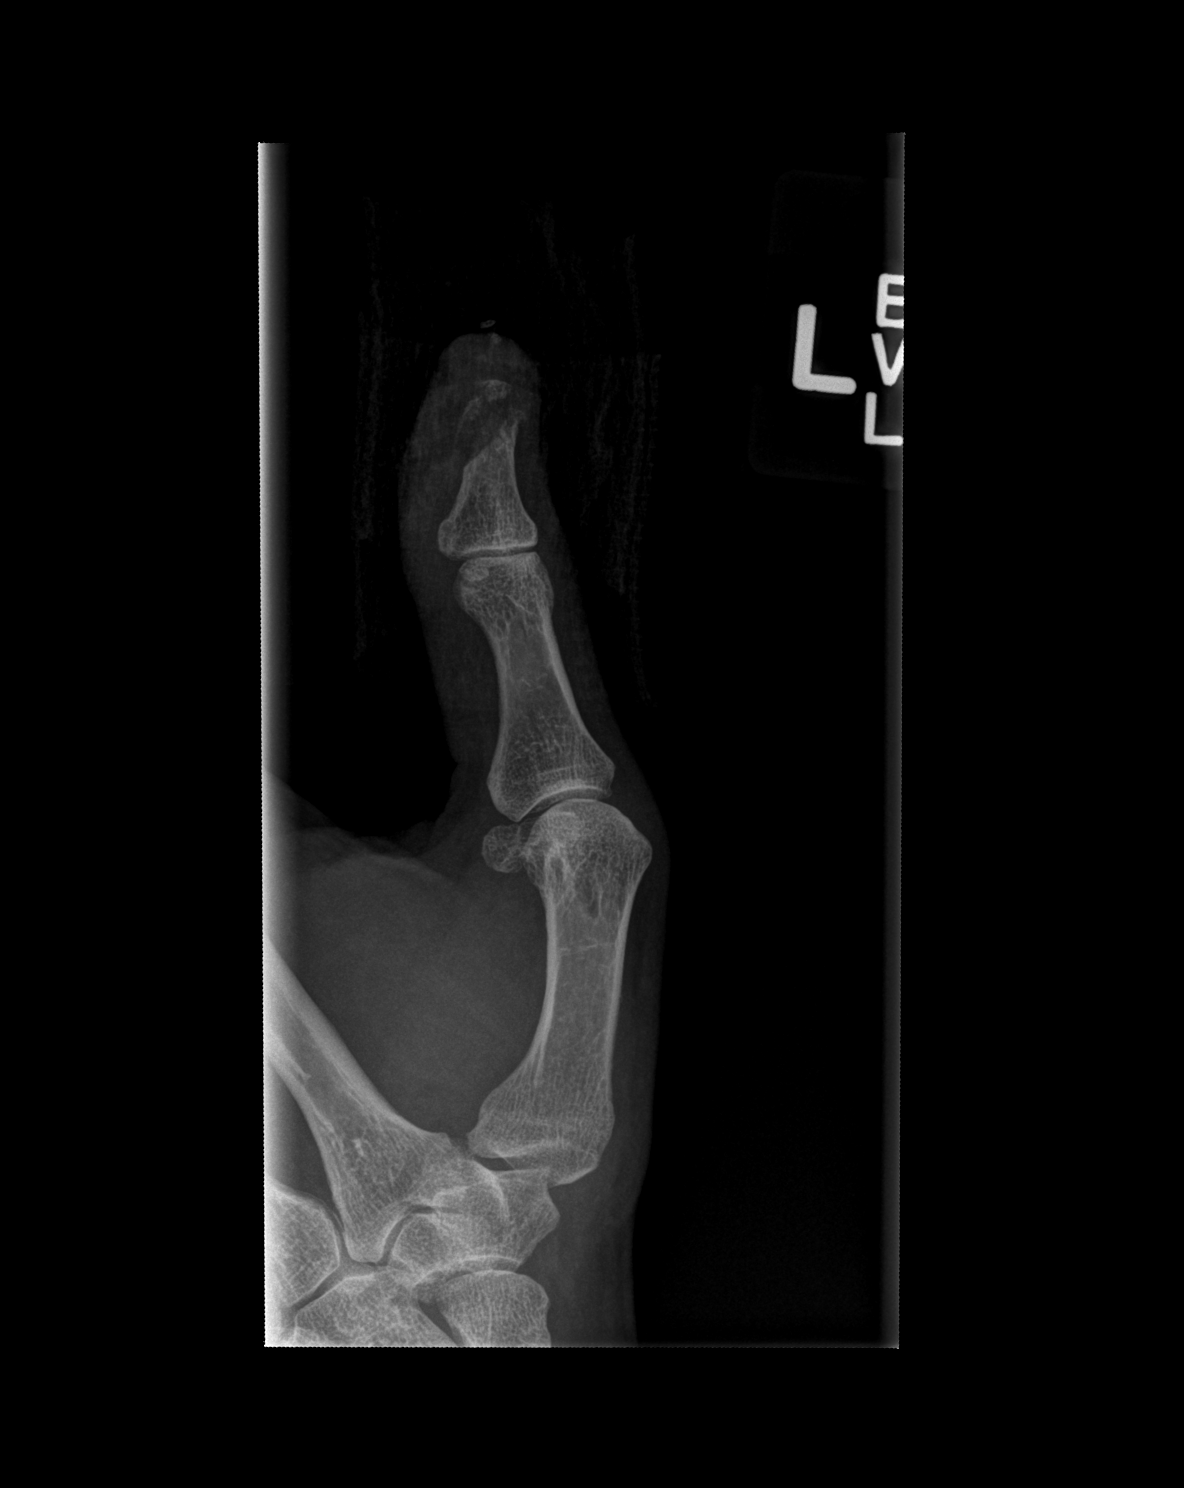

[x finger lat left]
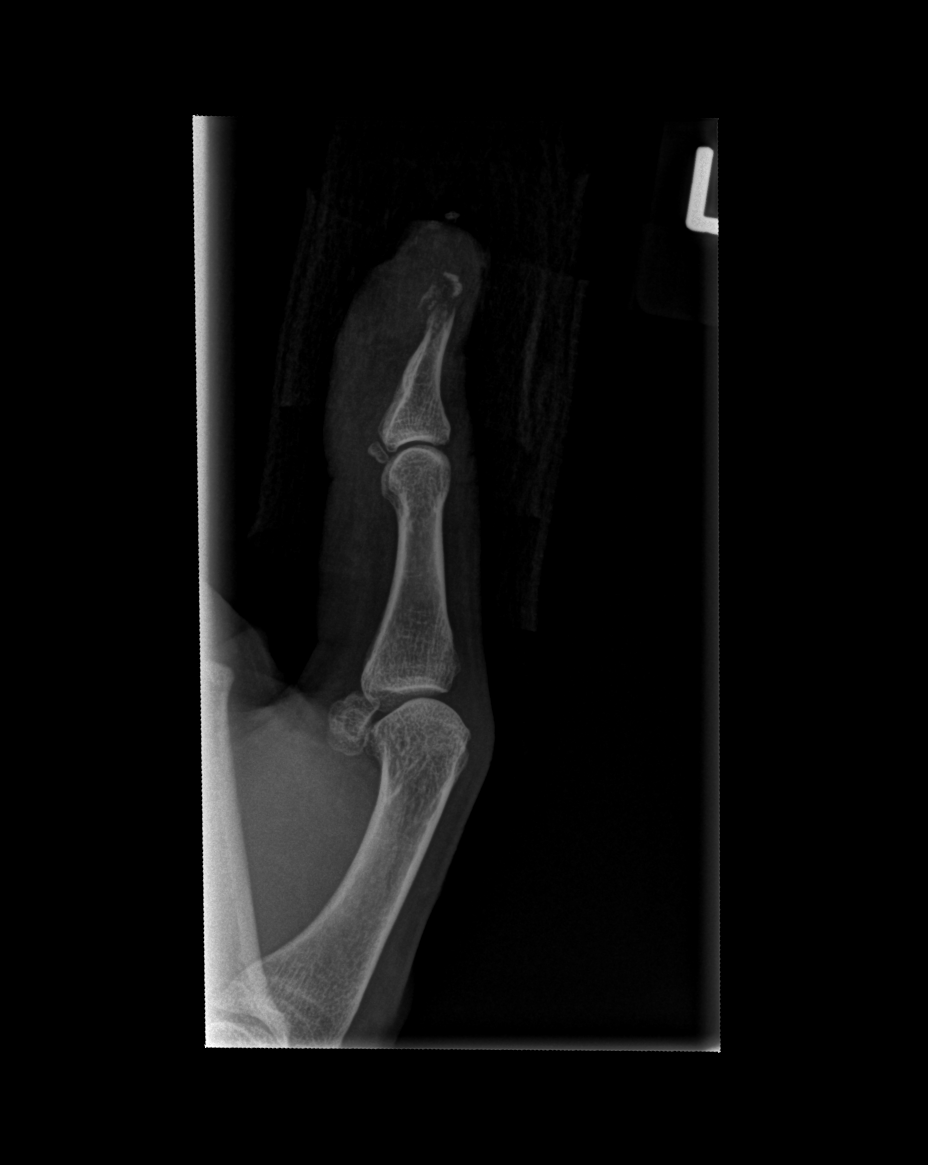

[3 of 3 positions shown; findings below may reference images not displayed]

FINDINGS: Markedly comminuted and distracted fracture involving the tuft and
distal shaft of the first distal phalanx with overlying soft tissue
injury. Suspected that there are some missing bone fragments.
IMPRESSION: Markedly comminuted and distracted fracture involving the tuft of
the first distal phalanx with overlying soft tissue injury.

## 2017-07-05 ENCOUNTER — Ambulatory Visit (INDEPENDENT_AMBULATORY_CARE_PROVIDER_SITE_OTHER): Payer: 59 | Admitting: Medical

## 2017-07-05 ENCOUNTER — Encounter: Payer: Self-pay | Admitting: Medical

## 2017-07-05 VITALS — BP 138/80 | HR 62 | Temp 98.2°F | Resp 16 | Ht 72.5 in | Wt 211.0 lb

## 2017-07-05 DIAGNOSIS — Z125 Encounter for screening for malignant neoplasm of prostate: Secondary | ICD-10-CM | POA: Diagnosis not present

## 2017-07-05 DIAGNOSIS — Z0000 Encounter for general adult medical examination without abnormal findings: Secondary | ICD-10-CM | POA: Diagnosis not present

## 2017-07-05 DIAGNOSIS — Z113 Encounter for screening for infections with a predominantly sexual mode of transmission: Secondary | ICD-10-CM

## 2017-07-05 NOTE — Patient Instructions (Signed)
For you wellness exam today I have ordered cbc, cmp, PSA, lipid panel, ua and hiv.  Vaccine given today none(up to date)  Recommend exercise and healthy diet.  We will let you know lab results as they come in.  Follow up date appointment will be determined after lab review.   Blood pressure is slightly high today.  Recommend check at home or at pharmacy and confirm that consistently less than 140/90.  If it is a little high but low-salt diet, cutting back on caffeine and exercise would probably bring it down to adequate level.  Please keep appointment with a dermatologist.  If if not associated with [facility] please have them fax office notes.   Preventive Care 40-64 Years, Male Preventive care refers to lifestyle choices and visits with your health care provider that can promote health and wellness. What does preventive care include?  A yearly physical exam. This is also called an annual well check.  Dental exams once or twice a year.  Routine eye exams. Ask your health care provider how often you should have your eyes checked.  Personal lifestyle choices, including: ? Daily care of your teeth and gums. ? Regular physical activity. ? Eating a healthy diet. ? Avoiding tobacco and drug use. ? Limiting alcohol use. ? Practicing safe sex. ? Taking low-dose aspirin every day starting at age 34. What happens during an annual well check? The services and screenings done by your health care provider during your annual well check will depend on your age, overall health, lifestyle risk factors, and family history of disease. Counseling Your health care provider may ask you questions about your:  Alcohol use.  Tobacco use.  Drug use.  Emotional well-being.  Home and relationship well-being.  Sexual activity.  Eating habits.  Work and work Statistician.  Screening You may have the following tests or measurements:  Height, weight, and BMI.  Blood pressure.  Lipid and  cholesterol levels. These may be checked every 5 years, or more frequently if you are over 5 years old.  Skin check.  Lung cancer screening. You may have this screening every year starting at age 56 if you have a 30-pack-year history of smoking and currently smoke or have quit within the past 15 years.  Fecal occult blood test (FOBT) of the stool. You may have this test every year starting at age 64.  Flexible sigmoidoscopy or colonoscopy. You may have a sigmoidoscopy every 5 years or a colonoscopy every 10 years starting at age 61.  Prostate cancer screening. Recommendations will vary depending on your family history and other risks.  Hepatitis C blood test.  Hepatitis B blood test.  Sexually transmitted disease (STD) testing.  Diabetes screening. This is done by checking your blood sugar (glucose) after you have not eaten for a while (fasting). You may have this done every 1-3 years.  Discuss your test results, treatment options, and if necessary, the need for more tests with your health care provider. Vaccines Your health care provider may recommend certain vaccines, such as:  Influenza vaccine. This is recommended every year.  Tetanus, diphtheria, and acellular pertussis (Tdap, Td) vaccine. You may need a Td booster every 10 years.  Varicella vaccine. You may need this if you have not been vaccinated.  Zoster vaccine. You may need this after age 96.  Measles, mumps, and rubella (MMR) vaccine. You may need at least one dose of MMR if you were born in 1957 or later. You may also need a  second dose.  Pneumococcal 13-valent conjugate (PCV13) vaccine. You may need this if you have certain conditions and have not been vaccinated.  Pneumococcal polysaccharide (PPSV23) vaccine. You may need one or two doses if you smoke cigarettes or if you have certain conditions.  Meningococcal vaccine. You may need this if you have certain conditions.  Hepatitis A vaccine. You may need this if  you have certain conditions or if you travel or work in places where you may be exposed to hepatitis A.  Hepatitis B vaccine. You may need this if you have certain conditions or if you travel or work in places where you may be exposed to hepatitis B.  Haemophilus influenzae type b (Hib) vaccine. You may need this if you have certain risk factors.  Talk to your health care provider about which screenings and vaccines you need and how often you need them. This information is not intended to replace advice given to you by your health care provider. Make sure you discuss any questions you have with your health care provider. Document Released: 06/03/2015 Document Revised: 01/25/2016 Document Reviewed: 03/08/2015 Elsevier Interactive Patient Education  [employer].

## 2017-07-05 NOTE — Progress Notes (Addendum)
   Subjective:    Patient ID: Ronald Ramos, male    DOB: 09-11-1960, 57 y.o.   MRN: [id number]  HPI  First time with me today.  He feels good.  Pt has no bp meds on list. No chronic problems.  Works Engineer, technical sales, he Emergency planning/management officer, Former Theme park manager, 8 cups coffee, moderate healthy diet. Married- 2 children.  Up to date on coloscopy.   Up go date on tetaus.    Review of Systems  Constitutional: Negative for chills, diaphoresis, fatigue and unexpected weight change.  Respiratory: Negative for cough, chest tightness, shortness of breath and wheezing.   Cardiovascular: Negative for chest pain and palpitations.  Gastrointestinal: Negative for abdominal distention, abdominal pain and anal bleeding.  Genitourinary: Negative for difficulty urinating, discharge, flank pain, frequency, hematuria, penile pain, penile swelling, scrotal swelling, testicular pain and urgency.  Musculoskeletal: Negative for back pain.  Neurological: Negative for dizziness, seizures, syncope, speech difficulty, weakness, light-headedness and headaches.  Hematological: Negative for adenopathy. Does not bruise/bleed easily.  Psychiatric/Behavioral: Negative for behavioral problems, confusion, dysphoric mood and hallucinations. The patient is not nervous/anxious.          Objective:   Physical Exam  General Mental Status- Alert. General Appearance- Not in acute distress.   Skin General: Scattered small moderate size moles on his back.  Left ear small growth at the top of his ear.  Neck Carotid Arteries- Normal color. Moisture- Normal Moisture. No carotid bruits. No JVD.  Chest and Lung Exam Auscultation: Breath Sounds:-Normal.  Cardiovascular Auscultation:Rythm- Regular. Murmurs & Other Heart Sounds:Auscultation of the heart reveals- No Murmurs.  Abdomen Inspection:-Inspeection Normal. Palpation/Percussion:Note:No mass. Palpation and Percussion of the abdomen reveal- Non Tender, Non Distended + BS, no  rebound or guarding.    Neurologic Cranial Nerve exam:- CN III-XII intact(No nystagmus), symmetric smile. Strength:- 5/5 equal and symmetric strength both upper and lower extremities.  Genital and rectal exam- deferred per patient.     Assessment & Plan:  For you wellness exam today I have ordered cbc, cmp, PSA, lipid panel, ua and hiv.  Vaccine given today none(up to date)  Recommend exercise and healthy diet.  We will let you know lab results as they come in.  Follow up date appointment will be determined after lab review.   Blood pressure is slightly high today.  Recommend check at home or at pharmacy and confirm that consistently less than 140/90.  If it is a little high but low-salt diet, cutting back on caffeine and exercise would probably bring it down to adequate level.  Please keep appointment with a dermatologist.  If if not associated with [facility] please have them fax office notes.  The 10-year ASCVD risk score Mikey Bussing DC Brooke Bonito., et al., 2013) is: 9.8%   Values used to calculate the score:     Age: 21 years     Sex: Male     Is Non-Hispanic African American: No     Diabetic: No     Tobacco smoker: No     Systolic Blood Pressure: 419 mmHg     Is BP treated: No     HDL Cholesterol: 40 mg/dL     Total Cholesterol: 232 mg/dL

## 2017-07-06 LAB — COMPREHENSIVE METABOLIC PANEL
AG Ratio: 1.5 (calc) (ref 1.0–2.5)
ALT: 30 U/L (ref 9–46)
AST: 21 U/L (ref 10–35)
Albumin: 4.3 g/dL (ref 3.6–5.1)
Alkaline phosphatase (APISO): 59 U/L (ref 40–115)
BUN: 15 mg/dL (ref 7–25)
CO2: 26 mmol/L (ref 20–32)
Calcium: 9.7 mg/dL (ref 8.6–10.3)
Chloride: 102 mmol/L (ref 98–110)
Creat: 1.01 mg/dL (ref 0.70–1.33)
Globulin: 2.8 g/dL (calc) (ref 1.9–3.7)
Glucose, Bld: 89 mg/dL (ref 65–99)
Potassium: 3.9 mmol/L (ref 3.5–5.3)
Sodium: 140 mmol/L (ref 135–146)
Total Bilirubin: 0.9 mg/dL (ref 0.2–1.2)
Total Protein: 7.1 g/dL (ref 6.1–8.1)

## 2017-07-06 LAB — CBC WITH DIFFERENTIAL/PLATELET
Basophils Absolute: 50 cells/uL (ref 0–200)
Basophils Relative: 0.9 %
Eosinophils Absolute: 140 cells/uL (ref 15–500)
Eosinophils Relative: 2.5 %
HCT: 43.9 % (ref 38.5–50.0)
Hemoglobin: 15.2 g/dL (ref 13.2–17.1)
Lymphs Abs: 1473 cells/uL (ref 850–3900)
MCH: 29.7 pg (ref 27.0–33.0)
MCHC: 34.6 g/dL (ref 32.0–36.0)
MCV: 85.7 fL (ref 80.0–100.0)
MPV: 10.7 fL (ref 7.5–12.5)
Monocytes Relative: 8.5 %
Neutro Abs: 3461 cells/uL (ref 1500–7800)
Neutrophils Relative %: 61.8 %
Platelets: 189 10*3/uL (ref 140–400)
RBC: 5.12 10*6/uL (ref 4.20–5.80)
RDW: 12.7 % (ref 11.0–15.0)
Total Lymphocyte: 26.3 %
WBC mixed population: 476 cells/uL (ref 200–950)
WBC: 5.6 10*3/uL (ref 3.8–10.8)

## 2017-07-06 LAB — URINALYSIS, ROUTINE W REFLEX MICROSCOPIC
Bilirubin Urine: NEGATIVE
Glucose, UA: NEGATIVE
Hgb urine dipstick: NEGATIVE
Ketones, ur: NEGATIVE
Leukocytes, UA: NEGATIVE
Nitrite: NEGATIVE
Protein, ur: NEGATIVE
Specific Gravity, Urine: 1.019 (ref 1.001–1.03)
pH: 5.5 (ref 5.0–8.0)

## 2017-07-06 LAB — PSA: PSA: 3 ng/mL (ref <=4.0)

## 2017-07-06 LAB — HIV ANTIBODY (ROUTINE TESTING W REFLEX): HIV 1&2 Ab, 4th Generation: NONREACTIVE

## 2017-07-06 LAB — LIPID PANEL
Cholesterol: 232 mg/dL — ABNORMAL HIGH (ref <200)
HDL: 40 mg/dL — ABNORMAL LOW (ref >40)
LDL Cholesterol (Calc): 162 mg/dL (calc) — ABNORMAL HIGH
Non-HDL Cholesterol (Calc): 192 mg/dL (calc) — ABNORMAL HIGH (ref <130)
Total CHOL/HDL Ratio: 5.8 (calc) — ABNORMAL HIGH (ref <5.0)
Triglycerides: 151 mg/dL — ABNORMAL HIGH (ref <150)

## 2017-07-24 DIAGNOSIS — Z23 Encounter for immunization: Secondary | ICD-10-CM | POA: Diagnosis not present

## 2017-07-24 DIAGNOSIS — D485 Neoplasm of uncertain behavior of skin: Secondary | ICD-10-CM | POA: Diagnosis not present

## 2017-07-24 DIAGNOSIS — L821 Other seborrheic keratosis: Secondary | ICD-10-CM | POA: Diagnosis not present

## 2017-07-24 DIAGNOSIS — H61032 Chondritis of left external ear: Secondary | ICD-10-CM | POA: Diagnosis not present

## 2018-02-04 DIAGNOSIS — C44629 Squamous cell carcinoma of skin of left upper limb, including shoulder: Secondary | ICD-10-CM | POA: Diagnosis not present

## 2018-02-04 DIAGNOSIS — L821 Other seborrheic keratosis: Secondary | ICD-10-CM | POA: Diagnosis not present

## 2018-02-04 DIAGNOSIS — D485 Neoplasm of uncertain behavior of skin: Secondary | ICD-10-CM | POA: Diagnosis not present

## 2018-02-27 DIAGNOSIS — C44629 Squamous cell carcinoma of skin of left upper limb, including shoulder: Secondary | ICD-10-CM | POA: Diagnosis not present

## 2023-12-21 ENCOUNTER — Encounter: Payer: Self-pay | Admitting: Internal Medicine

## 2024-04-24 ENCOUNTER — Telehealth: Payer: Self-pay

## 2024-04-24 NOTE — Telephone Encounter (Signed)
 Attempted to reach patient concerning colon recall; unable to speak with patient; left a message and number tot he office for patient to call back an schedule colonoscopy;

## 2024-05-13 NOTE — Telephone Encounter (Signed)
 Called and spoke with patient- patient reports he has already completed his colonoscopy at [facility] this year; transferred care to them; recall edited;
